# Patient Record
Sex: Female | Born: 1960 | Race: White | Hispanic: No | Marital: Married | Smoking: Never smoker
Health system: Southern US, Community
[De-identification: ages and names within clinical notes are randomized; demographics above are authoritative.]

---

## 2008-08-23 HISTORY — PX: ABDOMINAL HYSTERECTOMY: SHX81

## 2009-03-05 ENCOUNTER — Ambulatory Visit: Payer: Self-pay | Admitting: Obstetrics and Gynecology

## 2009-03-11 ENCOUNTER — Ambulatory Visit: Payer: Self-pay

## 2009-07-30 ENCOUNTER — Ambulatory Visit: Payer: Self-pay | Admitting: Obstetrics and Gynecology

## 2009-08-04 ENCOUNTER — Inpatient Hospital Stay: Payer: Self-pay | Admitting: Obstetrics and Gynecology

## 2009-09-11 ENCOUNTER — Ambulatory Visit: Payer: Self-pay

## 2010-03-10 ENCOUNTER — Ambulatory Visit: Payer: Self-pay

## 2011-04-09 ENCOUNTER — Ambulatory Visit: Payer: Self-pay

## 2012-04-12 ENCOUNTER — Ambulatory Visit: Payer: Self-pay

## 2013-06-01 ENCOUNTER — Ambulatory Visit: Payer: Self-pay

## 2013-07-05 ENCOUNTER — Ambulatory Visit: Payer: Self-pay

## 2014-09-03 ENCOUNTER — Ambulatory Visit: Payer: Self-pay

## 2015-09-16 ENCOUNTER — Other Ambulatory Visit: Payer: Self-pay | Admitting: Obstetrics and Gynecology

## 2015-09-16 DIAGNOSIS — Z1231 Encounter for screening mammogram for malignant neoplasm of breast: Secondary | ICD-10-CM

## 2015-09-23 ENCOUNTER — Ambulatory Visit
Admission: RE | Admit: 2015-09-23 | Discharge: 2015-09-23 | Disposition: A | Discharge status: Home / Self Care | Payer: BC Managed Care – PPO | Source: Ambulatory Visit | Attending: Obstetrics and Gynecology | Admitting: Obstetrics and Gynecology

## 2015-09-23 DIAGNOSIS — Z1231 Encounter for screening mammogram for malignant neoplasm of breast: Secondary | ICD-10-CM | POA: Insufficient documentation

## 2017-02-17 ENCOUNTER — Encounter: Payer: Self-pay | Admitting: Medical

## 2017-02-17 ENCOUNTER — Ambulatory Visit: Payer: Self-pay | Admitting: Medical

## 2017-02-17 VITALS — BP 130/90 | HR 79 | Temp 97.9°F | Resp 18 | Ht 64.0 in | Wt 207.0 lb

## 2017-02-17 DIAGNOSIS — M25562 Pain in left knee: Principal | ICD-10-CM

## 2017-02-17 DIAGNOSIS — M25561 Pain in right knee: Secondary | ICD-10-CM

## 2017-02-17 DIAGNOSIS — G8929 Other chronic pain: Secondary | ICD-10-CM

## 2017-02-17 NOTE — Progress Notes (Addendum)
Approved for standing desk, in basket to [employer].  Subjective:    Patient ID: Felicia Mason, female    DOB: 1961/04/23, 56 y.o.   MRN: [id number]  HPI  56 yo female started with bilateral knee pain since October with Right knee more painful. Medial pain and around the inferior part of the patella. Tennis seem to give some support. Started swimming aerobics this week with some relief.  Occasionally takes ibuprofen for pain , but does not like to take pills. No numbness or tingling. Periodically has to leave from sitting position to a standing positon and walk around for relief. Feels the knee catching in both knees. Has not exercised due to pain.   Review of Systems  Constitutional: Negative for chills and fever.  HENT: Positive for congestion. Negative for ear discharge and sore throat.   Eyes: Positive for discharge and itching.  Respiratory: Negative for cough and shortness of breath.   Cardiovascular: Negative for chest pain.  Gastrointestinal: Negative for abdominal pain.  Endocrine: Negative for polydipsia, polyphagia and polyuria.  Genitourinary: Negative for hematuria.  Musculoskeletal: Positive for gait problem.  Skin: Negative for rash.  Allergic/Immunologic: Positive for environmental allergies. Negative for food allergies.  Neurological: Negative for dizziness and syncope.  Hematological: Negative for adenopathy.  painful with walking.     Objective:   Physical Exam  Constitutional: She appears well-developed and well-nourished.  HENT:  Head: Normocephalic and atraumatic.  Eyes: EOM are normal. Pupils are equal, round, and reactive to light.  Neck: Normal range of motion.  Musculoskeletal: Normal range of motion. She exhibits no edema, tenderness or deformity.   Negative drawers test medial , lateral , anterior and posterior bilateral. 2+ popliteal pulses.       Assessment & Plan:  Bilateral knee pain Right worse then Left. Will send for x-rays.   obesity Discussed weight loss,  swimming is not painful and is good exercise.   Uses OTC allergy eye drops for her allergy symptoms which helps. Recommend standing desk for patient. To wear  Good supportive shoes. Prescripton note given to patient.  OTC Ibuprofen  200mg  one every 6 hours  5-6 days with food as needed for pain, take as directed.. Will call patient with results of x-rays, then refer to Orthopedics.

## 2017-02-18 ENCOUNTER — Ambulatory Visit
Admission: RE | Admit: 2017-02-18 | Discharge: 2017-02-18 | Disposition: A | Discharge status: Home / Self Care | Payer: BLUE CROSS/BLUE SHIELD | Source: Ambulatory Visit | Attending: Medical | Admitting: Medical

## 2017-02-18 DIAGNOSIS — M25562 Pain in left knee: Secondary | ICD-10-CM | POA: Diagnosis not present

## 2017-02-18 DIAGNOSIS — M25561 Pain in right knee: Secondary | ICD-10-CM | POA: Diagnosis not present

## 2017-02-18 DIAGNOSIS — M25569 Pain in unspecified knee: Secondary | ICD-10-CM | POA: Diagnosis present

## 2017-02-18 DIAGNOSIS — G8929 Other chronic pain: Secondary | ICD-10-CM

## 2017-02-21 ENCOUNTER — Telehealth: Payer: Self-pay | Admitting: Medical

## 2017-02-21 ENCOUNTER — Encounter: Payer: Self-pay | Admitting: Medical

## 2017-02-21 DIAGNOSIS — M25562 Pain in left knee: Secondary | ICD-10-CM

## 2017-02-21 DIAGNOSIS — M25561 Pain in right knee: Principal | ICD-10-CM

## 2017-02-21 DIAGNOSIS — G8929 Other chronic pain: Secondary | ICD-10-CM

## 2017-02-21 NOTE — Telephone Encounter (Signed)
Reviewed xray results with patient . Currently taking OTC Ibuprofen for pain on an as needed basis.  Will refer to Orthopedics for their recommendations. Right knee pain worse than left knee pain.

## 2017-02-22 ENCOUNTER — Ambulatory Visit: Payer: Self-pay | Admitting: Medical

## 2017-02-22 ENCOUNTER — Encounter: Payer: Self-pay | Admitting: Medical

## 2017-02-22 VITALS — BP 122/78 | HR 94 | Temp 99.4°F | Resp 16 | Ht 64.0 in | Wt 204.0 lb

## 2017-02-22 DIAGNOSIS — N390 Urinary tract infection, site not specified: Secondary | ICD-10-CM

## 2017-02-22 MED ORDER — CIPROFLOXACIN HCL 500 MG PO TABS: 500.0000 mg | ORAL_TABLET | Freq: Two times a day (BID) | ORAL | 0 refills | Status: DC

## 2017-02-22 NOTE — Progress Notes (Signed)
   Subjective:    Patient ID: Felicia Mason, female    DOB: Aug 17, 1961, 56 y.o.   MRN: [id number]  HPI 56 yo female last  2 days with urgency and getting up to go to the bathroom at night.Thinks she might have a urinary tract infection.   Review of Systems  Constitutional: Negative for chills and fever.  HENT: Positive for congestion. Negative for ear pain and sore throat.   Eyes: Negative for discharge and itching.  Respiratory: Negative for cough and shortness of breath.   Cardiovascular: Negative for chest pain.  Gastrointestinal: Negative for abdominal distention.  Genitourinary: Positive for frequency and urgency. Negative for dysuria and hematuria.  Musculoskeletal: Negative for back pain.  Skin: Negative for rash.  Neurological: Negative for dizziness and syncope.  Psychiatric/Behavioral: Negative for behavioral problems.   Urine dip positive for leuk esterase    Objective:   Physical Exam  Constitutional: She is oriented to person, place, and time. She appears well-developed and well-nourished.  HENT:  Head: Normocephalic and atraumatic.  Eyes: EOM are normal. Pupils are equal, round, and reactive to light.  Neck: Normal range of motion.  Musculoskeletal: Normal range of motion.  Neurological: She is alert and oriented to person, place, and time.  Skin: Skin is warm and dry.  Psychiatric: She has a normal mood and affect. Her behavior is normal. Judgment and thought content normal.  Nursing note and vitals reviewed.         Assessment & Plan:  IUrinary tract infection increase water intake  E-prescribe Cipro 500 mg  One tablet by mouth twice daily for seven days #14 no refills. Recommended OTC AZO take as directed for bladder spasms, will turn urine orange.  Return in  3-5 days if not improving.

## 2017-02-22 NOTE — Patient Instructions (Signed)
Urinary Tract Infection, Adult A urinary tract infection (UTI) is an infection of any part of the urinary tract. The urinary tract includes the:  Kidneys.  Ureters.  Bladder.  Urethra.  These organs make, store, and get rid of pee (urine) in the body. Follow these instructions at home:  Take over-the-counter and prescription medicines only as told by your doctor.  If you were prescribed an antibiotic medicine, take it as told by your doctor. Do not stop taking the antibiotic even if you start to feel better.  Avoid the following drinks: ? Alcohol. ? Caffeine. ? Tea. ? Carbonated drinks.  Drink enough fluid to keep your pee clear or pale yellow.  Keep all follow-up visits as told by your doctor. This is important.  Make sure to: ? Empty your bladder often and completely. Do not to hold pee for long periods of time. ? Empty your bladder before and after sex. ? Wipe from front to back after a bowel movement if you are female. Use each tissue one time when you wipe. Contact a doctor if:  You have back pain.  You have a fever.  You feel sick to your stomach (nauseous).  You throw up (vomit).  Your symptoms do not get better after 3 days.  Your symptoms go away and then come back. Get help right away if:  You have very bad back pain.  You have very bad lower belly (abdominal) pain.  You are throwing up and cannot keep down any medicines or water. This information is not intended to replace advice given to you by your health care provider. Make sure you discuss any questions you have with your health care provider. Document Released: 01/26/2008 Document Revised: 01/15/2016 Document Reviewed: 06/30/2015 Elsevier Interactive Patient Education  [employer]2018 Elsevier Inc. Return in 3-5 days if not improving.

## 2017-02-24 LAB — POCT URINALYSIS DIPSTICK
Bilirubin, UA: NEGATIVE
Glucose, UA: NEGATIVE
KETONES UA: NEGATIVE
Nitrite, UA: NEGATIVE
PH UA: 6.5 (ref 5.0–8.0)
PROTEIN UA: NEGATIVE
RBC UA: NEGATIVE
SPEC GRAV UA: 1.025 (ref 1.010–1.025)
UROBILINOGEN UA: 0.2 U/dL

## 2017-02-24 NOTE — Addendum Note (Signed)
Addended by: Azzie GlatterALLEN, Kareen W on: 02/24/2017 04:01 PM   Modules accepted: Orders

## 2017-03-08 ENCOUNTER — Telehealth: Payer: Self-pay

## 2017-03-08 NOTE — Telephone Encounter (Signed)
See telephone comment. 

## 2017-03-22 ENCOUNTER — Other Ambulatory Visit: Payer: Self-pay | Admitting: Obstetrics and Gynecology

## 2017-03-22 DIAGNOSIS — Z1231 Encounter for screening mammogram for malignant neoplasm of breast: Secondary | ICD-10-CM

## 2017-04-06 ENCOUNTER — Ambulatory Visit
Admission: RE | Admit: 2017-04-06 | Discharge: 2017-04-06 | Disposition: A | Discharge status: Home / Self Care | Payer: BLUE CROSS/BLUE SHIELD | Source: Ambulatory Visit | Attending: Obstetrics and Gynecology | Admitting: Obstetrics and Gynecology

## 2017-04-06 DIAGNOSIS — Z1231 Encounter for screening mammogram for malignant neoplasm of breast: Secondary | ICD-10-CM | POA: Diagnosis present

## 2017-08-30 IMAGING — MG MM DIGITAL SCREENING BILAT W/ CAD
4 series · 4 of 4 positions shown · non-contrast
Comparison: Previous exam(s).

CLINICAL DATA: Screening.

EXAM:
DIGITAL SCREENING BILATERAL MAMMOGRAM WITH CAD

[L MLO]
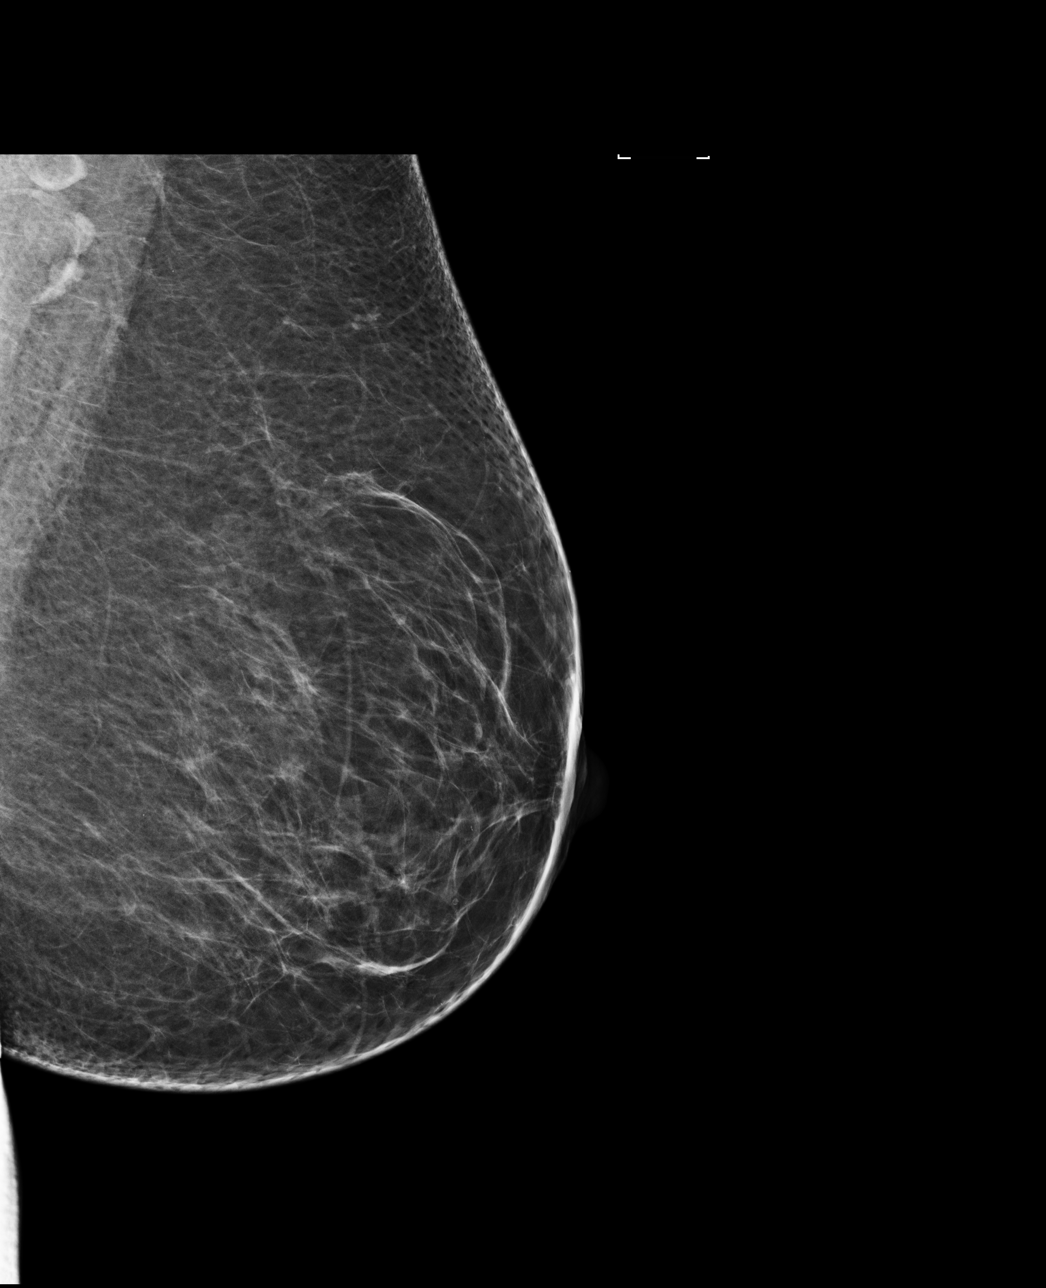

[R MLO]
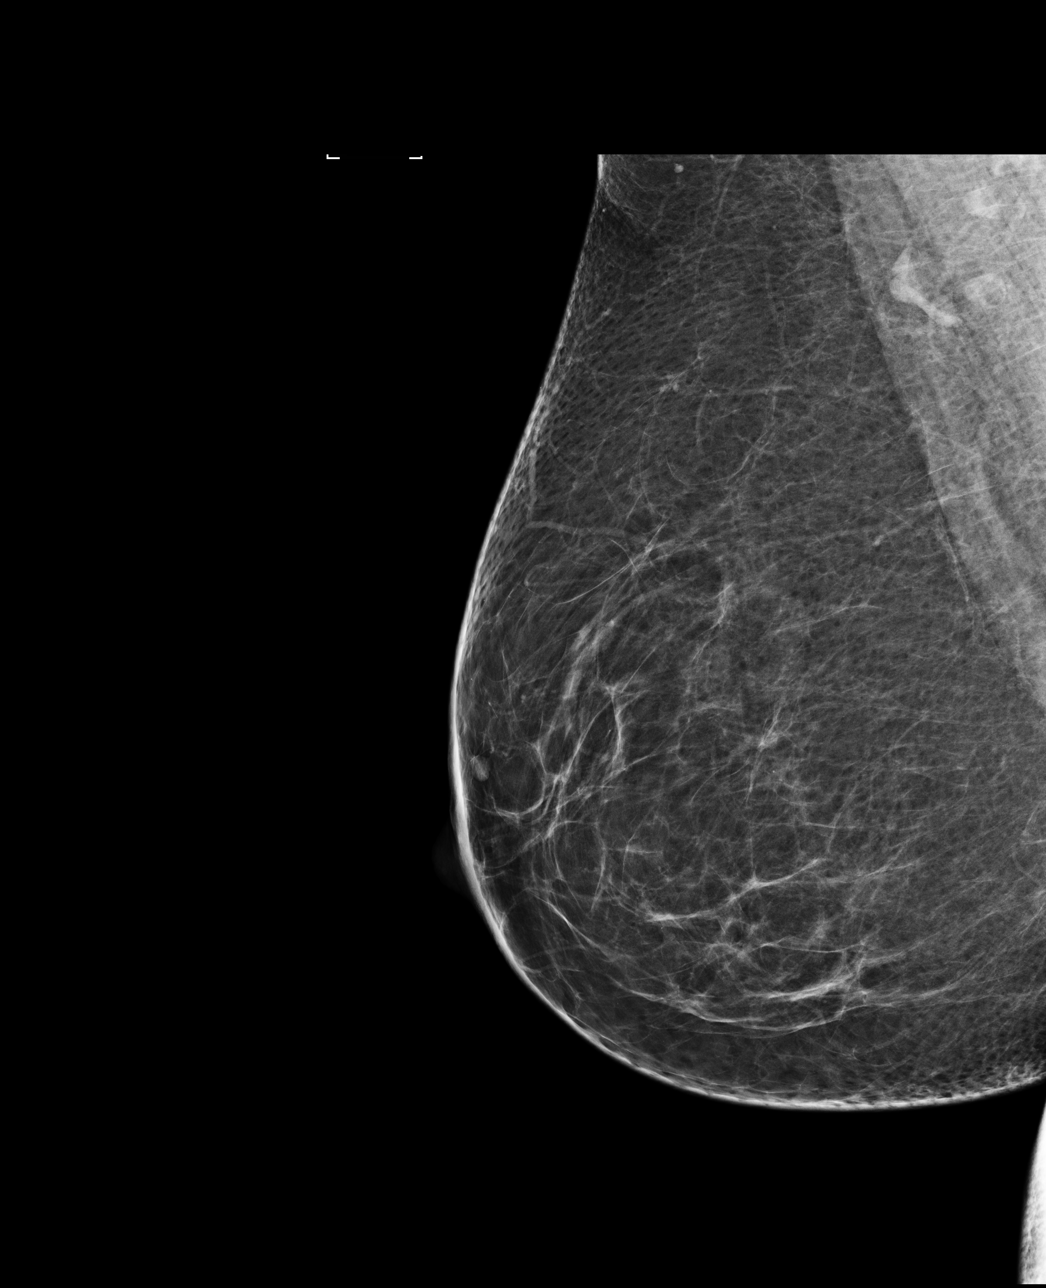

[R CC]
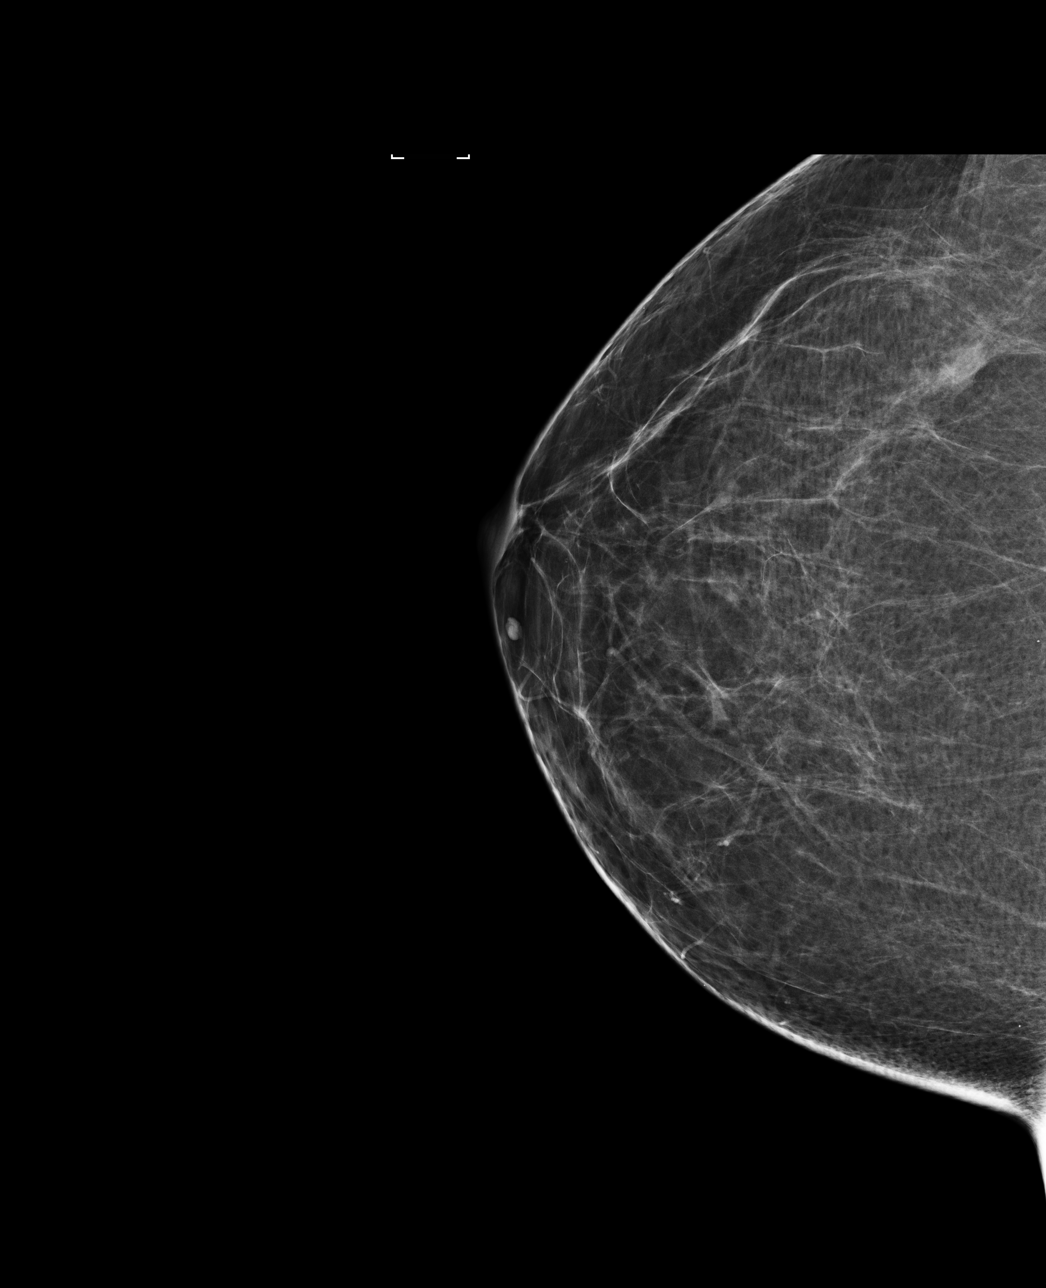

[L CC]
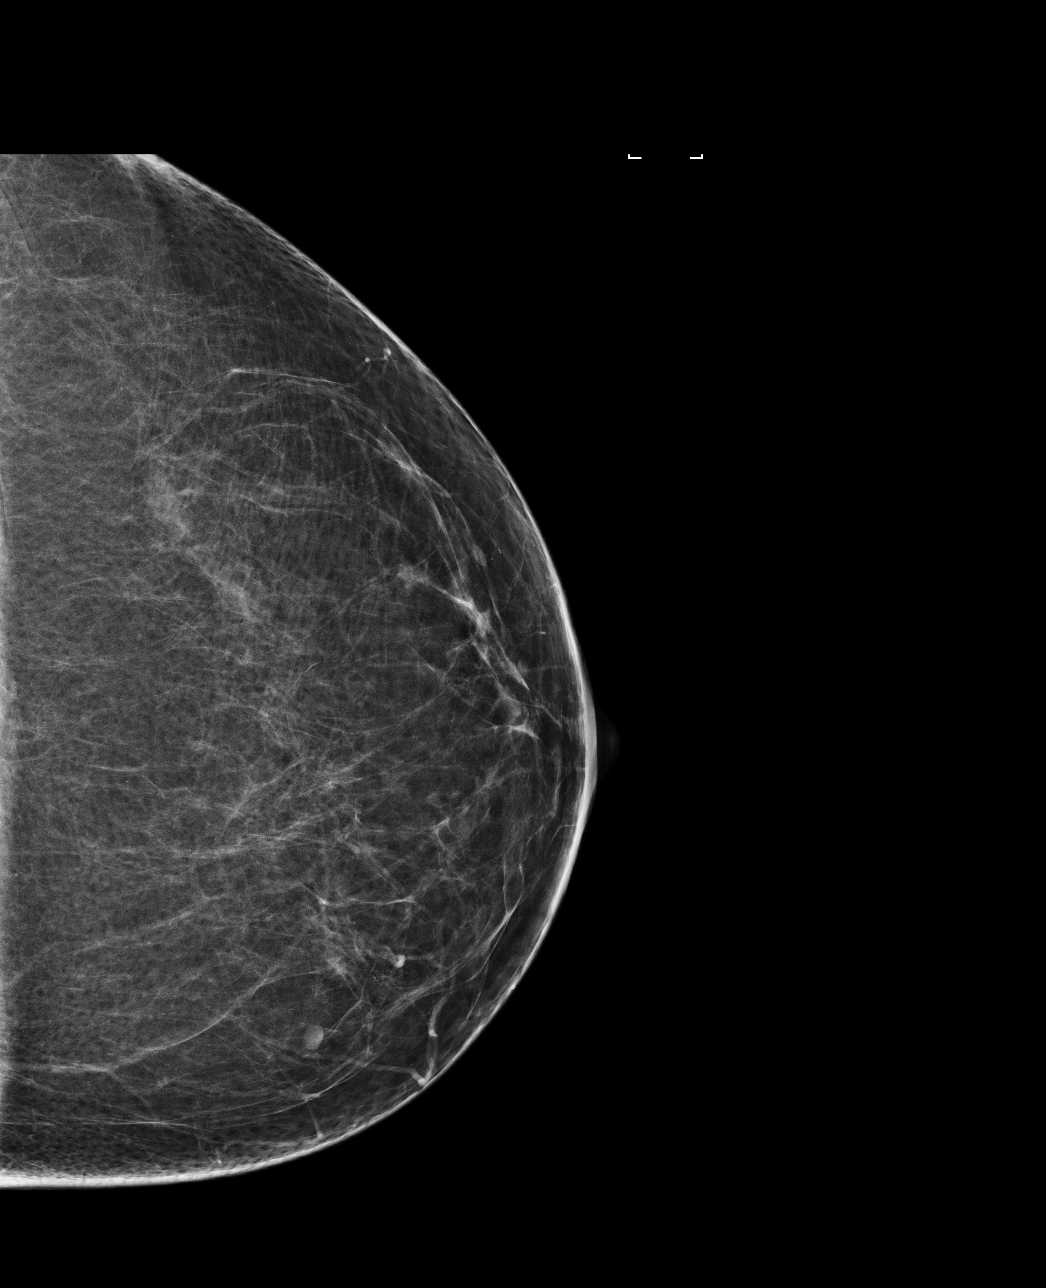

[4 of 4 positions shown; findings below may reference images not displayed]

ACR Breast Density Category b: There are scattered areas of
fibroglandular density.
FINDINGS: There are no findings suspicious for malignancy. Images were
processed with CAD.
IMPRESSION: No mammographic evidence of malignancy. A result letter of this
screening mammogram will be mailed directly to the patient.

RECOMMENDATION:
Screening mammogram in one year. (Code:AS-G-LCT)

BI-RADS CATEGORY  1: Negative.

## 2018-01-27 ENCOUNTER — Ambulatory Visit: Payer: Self-pay | Admitting: Adult Health

## 2018-01-27 VITALS — BP 104/80 | HR 80 | Temp 98.2°F | Resp 18 | Wt 206.4 lb

## 2018-01-27 DIAGNOSIS — L299 Pruritus, unspecified: Secondary | ICD-10-CM | POA: Insufficient documentation

## 2018-01-27 DIAGNOSIS — L259 Unspecified contact dermatitis, unspecified cause: Secondary | ICD-10-CM | POA: Insufficient documentation

## 2018-01-27 DIAGNOSIS — L089 Local infection of the skin and subcutaneous tissue, unspecified: Secondary | ICD-10-CM

## 2018-01-27 DIAGNOSIS — L237 Allergic contact dermatitis due to plants, except food: Secondary | ICD-10-CM

## 2018-01-27 MED ORDER — HYDROCORTISONE 1 % EX OINT: 1.0000 "application " | TOPICAL_OINTMENT | Freq: Two times a day (BID) | CUTANEOUS | 0 refills | Status: DC

## 2018-01-27 MED ORDER — PREDNISONE 10 MG (21) PO TBPK: ORAL_TABLET | ORAL | 0 refills | Status: DC

## 2018-01-27 MED ORDER — CEPHALEXIN 500 MG PO CAPS: 500.0000 mg | ORAL_CAPSULE | Freq: Two times a day (BID) | ORAL | 0 refills | Status: DC

## 2018-01-27 NOTE — Progress Notes (Signed)
Subjective:     Patient ID: Felicia Mason, female   DOB: 10-Jun-1961, 57 y.o.   MRN: [id number]   Blood pressure 104/80, pulse 80, temperature 98.2 F (36.8 C), temperature source Tympanic, resp. rate 18, weight 206 lb 6.4 oz (93.6 kg), SpO2 97 %. Poison [place]  This is a new problem. The current episode started in the past 7 days. The problem is unchanged. The affected locations include the right arm, left arm and chest (upper chest ). Pertinent negatives include no anorexia, congestion, cough, diarrhea, eye pain, facial edema, fatigue, fever, joint pain, nail changes, rhinorrhea, shortness of breath, sore throat or vomiting. Past treatments include cold compress and anti-itch cream. The treatment provided mild relief. Her past medical history is significant for allergies (food allergies ). There is no history of asthma.     Patient is a 57 year old female in no acute distress who comes to clinic with complaint of rash started last week. [place] [employer] . She has tried IVAREST poison IVY Itch cream without much improvement.- mild she reports.   Left arm she scratched intensely and reports she noticed it was getting red area around the posion oak in the center.   Denies any drainage from left arm area.Mild skin warmth.  Denies any tick bites.   Review of Systems  Constitutional: Negative.  Negative for fatigue and fever.  HENT: Negative.  Negative for congestion, rhinorrhea and sore throat.   Eyes: Negative.  Negative for pain.  Respiratory: Negative.  Negative for cough and shortness of breath.   Cardiovascular: Negative.   Gastrointestinal: Negative.  Negative for anorexia, diarrhea and vomiting.  Endocrine: Negative.   Genitourinary: Negative.   Musculoskeletal: Negative.  Negative for joint pain.  Skin: Positive for color change and rash. Negative for nail changes, pallor and wound.  Allergic/Immunologic: Negative.   Neurological: Negative.     Hematological: Negative.   Psychiatric/Behavioral: Negative.        Objective:   Physical Exam  Constitutional: She is oriented to person, place, and time. She appears well-developed and well-nourished. She is active.  Non-toxic appearance. She does not have a sickly appearance. She does not appear ill. No distress.  Patient is alert and oriented and responsive to questions Engages in eye contact with provider. Speaks in full sentences without any pauses without any shortness of breath or distress.    HENT:  Head: Normocephalic and atraumatic.  Right Ear: Hearing and external ear normal.  Left Ear: Hearing and external ear normal.  Nose: Nose normal. Right sinus exhibits no maxillary sinus tenderness and no frontal sinus tenderness. Left sinus exhibits no maxillary sinus tenderness and no frontal sinus tenderness.  Mouth/Throat: Uvula is midline, oropharynx is clear and moist and mucous membranes are normal. No uvula swelling. No oropharyngeal exudate. No tonsillar exudate.  Eyes: Pupils are equal, round, and reactive to light. Conjunctivae, EOM and lids are normal. Right eye exhibits no discharge. Left eye exhibits no discharge. Right conjunctiva is not injected. Right conjunctiva has no hemorrhage. Left conjunctiva is not injected. Left conjunctiva has no hemorrhage. No scleral icterus.  Neck: Trachea normal, normal range of motion, full passive range of motion without pain and phonation normal. Neck supple. No Brudzinski's sign noted.  Cardiovascular: Normal rate, regular rhythm, normal heart sounds and intact distal pulses. Exam reveals no gallop and no friction rub.  No murmur heard. Pulmonary/Chest: Effort normal and breath sounds normal. No stridor. No respiratory  distress. She has no wheezes. She has no rales. She exhibits no tenderness.  Abdominal: Soft.  Musculoskeletal: Normal range of motion.  Lymphadenopathy:       Head (right side): No submental, no submandibular, no  tonsillar, no preauricular, no posterior auricular and no occipital adenopathy present.       Head (left side): No submental, no submandibular, no tonsillar, no preauricular, no posterior auricular and no occipital adenopathy present.    She has no cervical adenopathy.  Neurological: She is alert and oriented to person, place, and time. She has normal strength. She displays normal reflexes. No cranial nerve deficit or sensory deficit. She exhibits normal muscle tone. She displays a negative Romberg sign. Coordination normal.  Skin: Skin is warm and dry. Capillary refill takes less than 2 seconds. Rash noted. Rash is maculopapular and vesicular. She is not diaphoretic. There is erythema. No pallor.     Areas of erythema with maculopapular vesicular rash- pruritic marked with black lines on diagram.   Pink flat erythema, mildly warm to touch 3 cm x cm marked with circular blackline on diagram, No drainage.   2 + radial pulse left and right.     Psychiatric: She has a normal mood and affect. Her speech is normal and behavior is normal. Judgment and thought content normal. Cognition and memory are normal.       Assessment:     Contact dermatitis due to poison oak  Contact dermatitis, unspecified contact dermatitis type, unspecified trigger  Skin infection  Pruritus     Plan:     Meds ordered this encounter  Medications  . cephALEXin (KEFLEX) 500 MG capsule    Sig: Take 1 capsule (500 mg total) by mouth 2 (two) times daily.    Dispense:  20 capsule    Refill:  0  . predniSONE (STERAPRED UNI-PAK 21 TAB) 10 MG (21) TBPK tablet    Sig: PO: Take 6 tablets on day 1:Take 5 tablets day 2:Take 4 tablets day 3: Take 3 tablets day 4:Take 2 tablets day five: 5 Take 1 tablet day 6    Dispense:  21 tablet    Refill:  0  . hydrocortisone 1 % ointment    Sig: Apply 1 application topically 2 (two) times daily.    Dispense:  30 g    Refill:  0  Advised to be seen if spreads to face or near  eyes at all.  If left arm erythema worsens return to office or be seen in urgent care/ ER.   Benadryl at bedtime per package instructions PRN Itching.  Cool showers and cool compresses only.  Do not take hot showers. Wash sheets etc. Do not touch from area to area. Avoid scratching.     Advised patient call the office or your primary care doctor for an appointment if no improvement within 72 hours or if any symptoms change or worsen at any time  Advised ER or urgent Care if after hours or on weekend. Call 911 for emergency symptoms at any time.Patinet verbalized understanding of all instructions given/reviewed and treatment plan and has no further questions or concerns at this time.   Patient verbalized understanding of all instructions given and denies any further questions at this time.

## 2018-01-27 NOTE — Patient Instructions (Signed)
Poison Ivy Dermatitis Poison ivy dermatitis is redness and soreness (inflammation) of the skin. It is caused by a chemical that is found on the leaves of the poison ivy plant. You may also have itching, a rash, and blisters. Symptoms often clear up in 1-2 weeks. You may get this condition by touching a poison ivy plant. You can also get it by touching something that has the chemical on it. This may include animals or objects that have come in contact with the plant. Follow these instructions at home: General instructions  Take or apply over-the-counter and prescription medicines only as told by your doctor.  If you touch poison ivy, wash your skin with soap and cold water right away.  Use hydrocortisone creams or calamine lotion as needed to help with itching.  Take oatmeal baths as needed. Use colloidal oatmeal. You can get this at a pharmacy or grocery store. Follow the instructions on the package.  Do not scratch or rub your skin.  While you have the rash, wash your clothes right after you wear them. Prevention  Know what poison ivy looks like so you can avoid it. This plant has three leaves with flowering branches on a single stem. The leaves are glossy. They have uneven edges that come to a point at the front.  If you have touched poison ivy, wash with soap and water right away. Be sure to wash under your fingernails.  When hiking or camping, wear long pants, a long-sleeved shirt, tall socks, and hiking boots. You can also use a lotion on your skin that helps to prevent contact with the chemical on the plant.  If you think that your clothes or outdoor gear came in contact with poison ivy, rinse them off with a garden hose before you bring them inside your house. Contact a doctor if:  You have open sores in the rash area.  You have more redness, swelling, or pain in the affected area.  You have redness that spreads beyond the rash area.  You have fluid, blood, or pus coming from  the affected area.  You have a fever.  You have a rash over a large area of your body.  You have a rash on your eyes, mouth, or genitals.  Your rash does not get better after a few days. Get help right away if:  Your face swells or your eyes swell shut.  You have trouble breathing.  You have trouble swallowing. This information is not intended to replace advice given to you by your health care provider. Make sure you discuss any questions you have with your health care provider. Document Released: 09/11/2010 Document Revised: 01/15/2016 Document Reviewed: 01/15/2015 Elsevier Interactive Patient Education  2018 Else[employer] Inc.   Cellulitis, Adult Cellulitis is a skin infection. The infected area is usually red and sore. This condition occurs most often in the arms and lower legs. It is very important to get treated for this condition. Follow these instructions at home:  Take over-the-counter and prescription medicines only as told by your doctor.  If you were prescribed an antibiotic medicine, take it as told by your doctor. Do not stop taking the antibiotic even if you start to feel better.  Drink enough fluid to keep your pee (urine) clear or pale yellow.  Do not touch or rub the infected area.  Raise (elevate) the infected area above the level of your heart while you are sitting or lying down.  Place warm or cold wet cloths (warm  or cold compresses) on the infected area. Do this as told by your doctor.  Keep all follow-up visits as told by your doctor. This is important. These visits let your doctor make sure your infection is not getting worse. Contact a doctor if:  You have a fever.  Your symptoms do not get better after 1-2 days of treatment.  Your bone or joint under the infected area starts to hurt after the skin has healed.  Your infection comes back. This can happen in the same area or another area.  You have a swollen bump in the infected area.  You have new  symptoms.  You feel ill and also have muscle aches and pains. Get help right away if:  Your symptoms get worse.  You feel very sleepy.  You throw up (vomit) or have watery poop (diarrhea) for a long time.  There are red streaks coming from the infected area.  Your red area gets larger.  Your red area turns darker. This information is not intended to replace advice given to you by your health care provider. Make sure you discuss any questions you have with your health care provider. Document Released: 01/26/2008 Document Revised: 01/15/2016 Document Reviewed: 06/18/2015 Elsevier Interactive Patient Education  2018 [employer]

## 2018-04-27 ENCOUNTER — Other Ambulatory Visit: Payer: Self-pay | Admitting: Obstetrics and Gynecology

## 2018-04-27 DIAGNOSIS — Z1231 Encounter for screening mammogram for malignant neoplasm of breast: Secondary | ICD-10-CM

## 2018-05-11 ENCOUNTER — Ambulatory Visit
Admission: RE | Admit: 2018-05-11 | Discharge: 2018-05-11 | Disposition: A | Discharge status: Home / Self Care | Payer: BLUE CROSS/BLUE SHIELD | Source: Ambulatory Visit | Attending: Obstetrics and Gynecology | Admitting: Obstetrics and Gynecology

## 2018-05-11 DIAGNOSIS — Z1231 Encounter for screening mammogram for malignant neoplasm of breast: Secondary | ICD-10-CM | POA: Diagnosis present

## 2018-10-30 ENCOUNTER — Encounter: Payer: Self-pay | Admitting: Medical

## 2018-10-30 ENCOUNTER — Other Ambulatory Visit: Payer: Self-pay

## 2018-10-30 ENCOUNTER — Ambulatory Visit: Payer: Self-pay | Admitting: Medical

## 2018-10-30 VITALS — BP 134/73 | HR 94 | Temp 98.8°F | Resp 16 | Wt 216.2 lb

## 2018-10-30 DIAGNOSIS — N3000 Acute cystitis without hematuria: Secondary | ICD-10-CM

## 2018-10-30 DIAGNOSIS — R300 Dysuria: Secondary | ICD-10-CM

## 2018-10-30 LAB — POCT URINALYSIS DIPSTICK
Bilirubin, UA: NEGATIVE
Blood, UA: NEGATIVE
Glucose, UA: NEGATIVE
Ketones, UA: NEGATIVE
Nitrite, UA: NEGATIVE
Protein, UA: POSITIVE — AB
Spec Grav, UA: 1.025 (ref 1.010–1.025)
UROBILINOGEN UA: 0.2 U/dL
pH, UA: 5 (ref 5.0–8.0)

## 2018-10-30 MED ORDER — CIPROFLOXACIN HCL 500 MG PO TABS: 500.0000 mg | ORAL_TABLET | Freq: Two times a day (BID) | ORAL | 0 refills | Status: DC

## 2018-10-30 NOTE — Patient Instructions (Signed)
cipro Ciprofloxacin tablets What is this medicine? CIPROFLOXACIN (sip roe FLOX a sin) is a quinolone antibiotic. It is used to treat certain kinds of bacterial infections. It will not work for colds, flu, or other viral infections. This medicine may be used for other purposes; ask your health care provider or pharmacist if you have questions. COMMON BRAND NAME(S): Cipro What should I tell my health care provider before I take this medicine? They need to know if you have any of these conditions: -bone problems -diabetes -heart disease -high blood pressure -history of irregular heartbeat -history of low levels of potassium in the blood -joint problems -kidney disease -liver disease -mental illness -myasthenia gravis -seizures -tendon problems -tingling of the fingers or toes, or other nerve disorder -an unusual or allergic reaction to ciprofloxacin, other antibiotics or medicines, foods, dyes, or preservatives -pregnant or trying to get pregnant -breast-feeding How should I use this medicine? Take this medicine by mouth with a full glass of water. Follow the directions on the prescription label. You can take it with or without food. If it upsets your stomach, take it with food. Take your medicine at regular intervals. Do not take your medicine more often than directed. Take all of your medicine as directed even if you think you are better. Do not skip doses or stop your medicine early. Avoid antacids, aluminum, calcium, iron, magnesium, and zinc products for 6 hours before and 2 hours after taking a dose of this medicine. A special MedGuide will be given to you by the pharmacist with each prescription and refill. Be sure to read this information carefully each time. Talk to your pediatrician regarding the use of this medicine in children. Special care may be needed. Overdosage: If you think you have taken too much of this medicine contact a poison control center or emergency room at  once. NOTE: This medicine is only for you. Do not share this medicine with others. What if I miss a dose? If you miss a dose, take it as soon as you can. If it is almost time for your next dose, take only that dose. Do not take double or extra doses. What may interact with this medicine? Do not take this medicine with any of the following medications: -cisapride -dofetilide -dronedarone -flibanserin -lomitapide -pimozide -thioridazine -tizanidine -ziprasidone This medicine may also interact with the following medications: -antacids -birth control pills -caffeine -certain medicines for diabetes, like glipizide, glyburide, or insulin -certain medicines that treat or prevent blood clots like warfarin -clozapine -cyclosporine -didanosine buffered tablets or powder -duloxetine -lanthanum carbonate -lidocaine -methotrexate -multivitamins -NSAIDS, medicines for pain and inflammation, like ibuprofen or naproxen -olanzapine -omeprazole -other medicines that prolong the QT interval (cause an abnormal heart rhythm) -phenytoin -probenecid -ropinirole -sevelamer -sildenafil -sucralfate -theophylline -zolpidem This list may not describe all possible interactions. Give your health care provider a list of all the medicines, herbs, non-prescription drugs, or dietary supplements you use. Also tell them if you smoke, drink alcohol, or use illegal drugs. Some items may interact with your medicine. What should I watch for while using this medicine? Tell your doctor or healthcare professional if your symptoms do not start to get better or if they get worse. Do not treat diarrhea with over the counter products. Contact your doctor if you have diarrhea that lasts more than 2 days or if it is severe and watery. Check with your doctor or health care professional if you get an attack of severe diarrhea, nausea and vomiting, or if you  sweat a lot. The loss of too much body fluid can make it  dangerous for you to take this medicine. This medicine may affect blood sugar levels. If you have diabetes, check with your doctor or health care professional before you change your diet or the dose of your diabetic medicine. You may get drowsy or dizzy. Do not drive, use machinery, or do anything that needs mental alertness until you know how this medicine affects you. Do not sit or stand up quickly, especially if you are an older patient. This reduces the risk of dizzy or fainting spells. This medicine can make you more sensitive to the sun. Keep out of the sun. If you cannot avoid being in the sun, wear protective clothing and use a sunscreen. Do not use sun lamps or tanning beds/booths. What side effects may I notice from receiving this medicine? Side effects that you should report to your doctor or health care professional as soon as possible: -allergic reactions like skin rash or hives, swelling of the face, lips, or tongue -anxious -bloody or watery diarrhea -confusion -depressed mood -fast, irregular heartbeat -fever -hallucination, loss of contact with reality -joint, muscle, or tendon pain or swelling -loss of memory -pain, tingling, numbness in the hands or feet -seizures -signs and symptoms of aortic dissection such as sudden chest, stomach, or back pain -signs and symptoms of high blood sugar such as dizziness; dry mouth; dry skin; fruity breath; nausea; stomach pain; increased hunger or thirst; increased urination -signs and symptoms of liver injury like dark yellow or brown urine; general ill feeling or flu-like symptoms; light-colored stools; loss of appetite; nausea; right upper belly pain; unusually weak or tired; yellowing of the eyes or skin -signs and symptoms of low blood sugar such as feeling anxious; confusion; dizziness; increased hunger; unusually weak or tired; sweating; shakiness; cold; irritable; headache; blurred vision; fast heartbeat; loss of consciousness; pale  skin -suicidal thoughts or other mood changes -sunburn -unusually weak or tired Side effects that usually do not require medical attention (report to your doctor or health care professional if they continue or are bothersome): -dry mouth -headache -nausea -trouble sleeping This list may not describe all possible side effects. Call your doctor for medical advice about side effects. You may report side effects to FDA at 1-800-FDA-1088. Where should I keep my medicine? Keep out of the reach of children. Store at room temperature below 30 degrees C (86 degrees F). Keep container tightly closed. Throw away any unused medicine after the expiration date. NOTE: This sheet is a summary. It may not cover all possible information. If you have questions about this medicine, talk to your doctor, pharmacist, or health care provider.  2019 Elsevier/Gold Standard (2018-03-29 15:54:38)

## 2018-10-30 NOTE — Progress Notes (Signed)
   Subjective:    Patient ID: Felicia Mason, female    DOB: [birth year], 58 y.o.   MRN: [id number]  HPI 58 yo female Started on Frday , Urgent care closed on Saturday.  Last UTI one year ago took Cipro without difficulty. Denies fever, mild back pain, no cough or shortness of breath, hx of environmental allergies(ie pollen) Denies CP or shortness of breath..  Blood pressure 134/73, pulse 94, temperature 98.8 F (37.1 C), temperature source Tympanic, resp. rate 16, weight 216 lb 3.2 oz (98.1 kg), SpO2 97 %. No Known Allergies  Review of Systems  Constitutional: Positive for fever (Saturday). Negative for chills and fatigue.  Respiratory: Negative for cough and shortness of breath.   Cardiovascular: Negative for chest pain.  Genitourinary: Positive for decreased urine volume, difficulty urinating, dysuria, genital sores and urgency. Negative for frequency, hematuria, vaginal bleeding, vaginal discharge and vaginal pain.  Musculoskeletal: Positive for back pain (back pain started Friday). Negative for myalgias.  Skin: Negative for wound.  Allergic/Immunologic: Positive for environmental allergies. Negative for food allergies.  Neurological: Negative for dizziness, syncope, light-headedness and headaches.  Hematological: Negative for adenopathy.  Psychiatric/Behavioral: Negative for agitation, self-injury and suicidal ideas.       Objective:   Physical Exam Vitals signs reviewed.  Constitutional:      Appearance: Normal appearance. She is obese.  HENT:     Head: Normocephalic and atraumatic.     Mouth/Throat:     Mouth: Mucous membranes are moist.  Eyes:     Extraocular Movements: Extraocular movements intact.     Conjunctiva/sclera: Conjunctivae normal.     Pupils: Pupils are equal, round, and reactive to light.  Neck:     Musculoskeletal: Normal range of motion.  Pulmonary:     Effort: Pulmonary effort is normal.  Abdominal:     General: Bowel sounds are normal. There  is no distension.     Palpations: Abdomen is soft. There is no mass.     Tenderness: There is no abdominal tenderness. There is no right CVA tenderness, left CVA tenderness, guarding or rebound.     Hernia: No hernia is present.  Musculoskeletal: Normal range of motion.  Skin:    General: Skin is warm and dry.  Neurological:     General: No focal deficit present.     Mental Status: She is alert and oriented to person, place, and time.  Psychiatric:        Mood and Affect: Mood normal.        Behavior: Behavior normal.        Thought Content: Thought content normal.        Judgment: Judgment normal.   no swelling in ankles.        Assessment & Plan:  UTI C&S sent off of urine. Will call if resistance. Meds ordered this encounter  Medications  . ciprofloxacin (CIPRO) 500 MG tablet    Sig: Take 1 tablet (500 mg total) by mouth 2 (two) times daily.    Dispense:  6 tablet    Refill:  0   Increase water intake , my use OTC Azo per package instructions. Return in 3-5 days if not improving. Patient verbalizes understanding and has no questions at discharge.

## 2018-11-01 LAB — URINE CULTURE

## 2018-11-09 ENCOUNTER — Telehealth: Payer: Self-pay

## 2018-11-09 NOTE — Telephone Encounter (Signed)
Follow up call to patient after being placed on Cipro for UTI.  She has completed the dosage and is feeling much better (asymptomatic). Patient will follow up with the clinic as needed.

## 2019-05-09 ENCOUNTER — Other Ambulatory Visit: Payer: Self-pay | Admitting: Obstetrics and Gynecology

## 2019-05-09 DIAGNOSIS — Z1231 Encounter for screening mammogram for malignant neoplasm of breast: Secondary | ICD-10-CM

## 2019-05-23 ENCOUNTER — Ambulatory Visit: Payer: Self-pay

## 2019-05-23 ENCOUNTER — Other Ambulatory Visit: Payer: Self-pay

## 2019-05-23 DIAGNOSIS — Z23 Encounter for immunization: Secondary | ICD-10-CM

## 2019-06-15 ENCOUNTER — Ambulatory Visit
Admission: RE | Admit: 2019-06-15 | Discharge: 2019-06-15 | Disposition: A | Discharge status: Home / Self Care | Payer: BC Managed Care – PPO | Source: Ambulatory Visit | Attending: Obstetrics and Gynecology | Admitting: Obstetrics and Gynecology

## 2019-06-15 ENCOUNTER — Other Ambulatory Visit: Payer: Self-pay

## 2019-06-15 DIAGNOSIS — Z1231 Encounter for screening mammogram for malignant neoplasm of breast: Secondary | ICD-10-CM | POA: Insufficient documentation

## 2020-05-28 ENCOUNTER — Other Ambulatory Visit: Payer: Self-pay | Admitting: Obstetrics and Gynecology

## 2020-05-28 DIAGNOSIS — Z1231 Encounter for screening mammogram for malignant neoplasm of breast: Secondary | ICD-10-CM

## 2020-06-26 ENCOUNTER — Ambulatory Visit
Admission: RE | Admit: 2020-06-26 | Discharge: 2020-06-26 | Disposition: A | Discharge status: Home / Self Care | Payer: BC Managed Care – PPO | Source: Ambulatory Visit | Attending: Obstetrics and Gynecology | Admitting: Obstetrics and Gynecology

## 2020-06-26 ENCOUNTER — Other Ambulatory Visit: Payer: Self-pay

## 2020-06-26 DIAGNOSIS — Z1231 Encounter for screening mammogram for malignant neoplasm of breast: Secondary | ICD-10-CM | POA: Insufficient documentation

## 2020-11-17 ENCOUNTER — Telehealth: Payer: Self-pay | Admitting: Medical

## 2020-11-17 ENCOUNTER — Other Ambulatory Visit: Payer: Self-pay

## 2020-11-17 DIAGNOSIS — U071 COVID-19: Secondary | ICD-10-CM

## 2020-11-17 NOTE — Patient Instructions (Signed)

## 2020-11-17 NOTE — Progress Notes (Signed)
   Subjective:    Patient ID: Felicia Mason, female    DOB: 08/06/1961, 60 y.o.   MRN: [id number]  HPI 60 yo female in non acute distress, consents to telemedicne appointment. On Sunday 11/16/2020 with chills, feverish feeling, malaise, nasal congestion, HA, mild cough due to PND. Denies any CP, SOB, abdominal pain or diarrhea. She states initially she thought it was allergies but then she looked up symptoms of Covid-19 ( symptoms are similar) so she did a Covid-19 test which resulted positive. Treating self with Menthol and children's Claritn for her allergies.  No Known Allergies   Review of Systems  Constitutional: Positive for chills and fever (possibly).  HENT: Positive for congestion, postnasal drip, rhinorrhea, sinus pressure, sinus pain and sore throat.   Respiratory: Positive for cough (due to PND). Negative for shortness of breath.   Cardiovascular: Negative for chest pain.  Gastrointestinal: Negative for abdominal pain and diarrhea.  Allergic/Immunologic: Positive for environmental allergies.  Neurological: Positive for headaches.       Objective:   Physical Exam AXOX3 No acute distress noted during phone call. Patient does sound congested, no cough noted  Or SOB.Marland Kitchen No physical exam performed due to telemedicine appointment.      Home test Covid-19 Positive Assessment & Plan:  Covid-19 Positive Isolate, Rest, increase fluids,  OTC Motrin or Tylenol for pain or fever, take per package instructions. OTC Delsym for cough, OTC plain Mucinex as an expectorant. Recommended  OTC Flonase for nasal congestion. May return to work on April 4th 2022. Call the office if you have any further questions or concerns. Patients verbalizes understanding and has no questions at the end of our phone call.

## 2021-01-28 ENCOUNTER — Ambulatory Visit: Payer: BC Managed Care – PPO | Admitting: Medical

## 2021-01-28 ENCOUNTER — Other Ambulatory Visit: Payer: Self-pay

## 2021-01-28 ENCOUNTER — Encounter: Payer: Self-pay | Admitting: Medical

## 2021-01-28 VITALS — BP 124/86 | HR 96 | Temp 98.5°F | Resp 18 | Wt 219.8 lb

## 2021-01-28 DIAGNOSIS — T63441A Toxic effect of venom of bees, accidental (unintentional), initial encounter: Secondary | ICD-10-CM

## 2021-01-28 MED ORDER — AMOXICILLIN-POT CLAVULANATE 875-125 MG PO TABS: 1.0000 | ORAL_TABLET | Freq: Two times a day (BID) | ORAL | 0 refills | Status: DC

## 2021-01-28 NOTE — Progress Notes (Addendum)
   Subjective:    Patient ID: Felicia Mason, female    DOB: [birth year], 60 y.o.   MRN: [id number]  HPI  60 yo female  In non acute distress, stung by a bee on Sunday night to right ankle. Area is swollen and itchy not painful. She wanted it checked.  Blood pressure 124/86, pulse 96, temperature 98.5 F (36.9 C), temperature source Oral, resp. rate 18, weight 219 lb 12.8 oz (99.7 kg).  No Known Allergies   Review of Systems  Constitutional: Negative for chills and fever.  Musculoskeletal: Positive for joint swelling (right ankle , sting on medial side of ankle.). Negative for gait problem.  Skin: Positive for color change (erythema).       Swollen ankle and foot on the right foot/ankle.       Objective:   Physical Exam Vitals and nursing note reviewed.  Constitutional:      Appearance: Normal appearance.  HENT:     Head: Normocephalic and atraumatic.     Mouth/Throat:     Mouth: Mucous membranes are moist.     Pharynx: Oropharynx is clear.  Eyes:     Extraocular Movements: Extraocular movements intact.     Conjunctiva/sclera: Conjunctivae normal.     Pupils: Pupils are equal, round, and reactive to light.  Cardiovascular:     Rate and Rhythm: Normal rate and regular rhythm.     Pulses: Normal pulses.  Pulmonary:     Effort: Pulmonary effort is normal.  Musculoskeletal:        General: Swelling and tenderness present.     Cervical back: Normal range of motion.     Right lower leg: Edema (ankle into foot) present.  Skin:    General: Skin is warm and dry.     Capillary Refill: Capillary refill takes less than 2 seconds.     Findings: Erythema present.  Neurological:     General: No focal deficit present.     Mental Status: She is alert and oriented to person, place, and time.  Psychiatric:        Mood and Affect: Mood normal.        Behavior: Behavior normal.        Thought Content: Thought content normal.        Judgment: Judgment normal.            Assessment & Plan:  Bee sting, right ankle ( medially) ICE, Elevate, Compression ( w/ace bandage, showed patient how to wrap foot). Ibuprofen 800mg  ( 4 tablets ) every 8 hours to help with swelling x 5-7 days, take with food.. Patient declines prednisone at this time " it makes me mean". Meds ordered this encounter  Medications  . amoxicillin-clavulanate (AUGMENTIN) 875-125 MG tablet    Sig: Take 1 tablet by mouth 2 (two) times daily.    Dispense:  20 tablet    Refill:  0  take antibiotics with food. Take childrens Claritin for swelling and itching per package directions.  (patient is sensitive to medications so this is why she does childrens Claritin). Return or call the clinic or seek medical care,  if ankle/foot  is worsening or increased redness, or fever or chills. I demonstrated how to ace  wrap ankle /foot to patient. Patient verbalizes understanding and has no questions at discharge.

## 2021-01-28 NOTE — Patient Instructions (Addendum)
CE, Elevate, Compression ( w/ace bandage, showed patient how to wrap foot). Ibuprofen 842m ( 4 tablets ) every 8 hours to help with swelling x 5-7 days, take with food.. Patient declines prednisone at this time " it makes me mean". Meds ordered this encounter  Medications  . amoxicillin-clavulanate (AUGMENTIN) 875-125 MG tablet    Sig: Take 1 tablet by mouth 2 (two) times daily.    Dispense:  20 tablet    Refill:  0  take antibiotics with food. Take childrens Claritin for swelling and itching per package Return or call the clinic or seek medical care,  if ankle/foot  is worsening or increased redness, or fever or chills. I demonstrated how to ace  wrap ankle /foot to patient.      Bee, Wasp, or [employer] Adult Bees, wasps, and hornets are part of a family of insects that can sting people. These stings can cause pain and inflammation, but they are usually not serious. However, some people may have an allergic reaction to a sting. This can cause the symptoms to be more severe. What increases the risk? You may be at a greater risk of getting stung if you:  Provoke a stinging insect by swatting or disturbing it.  Wear strong-smelling soaps, deodorants, or body sprays.  Spend time outdoors near gardens with flowers or fruit trees or in clothes that expose skin.  Eat or drink outside. What are the signs or symptoms? Common symptoms of this condition include:  A red lump in the skin that sometimes has a tiny hole in the center. In some cases, a stinger may be in the center of the wound.  Pain and itching at the sting site.  Redness and swelling around the sting site. If you have an allergic reaction (localized allergic reaction), the swelling and redness may spread out from the sting site. In some cases, this reaction can continue to develop over the next 24-48 hours. In rare cases, a person may have a severe allergic reaction (anaphylactic reaction) to a sting. Symptoms of an  anaphylactic reaction may include:  Wheezing or difficulty breathing.  Raised, itchy, red patches on the skin (hives).  Nausea or vomiting.  Abdominal cramping.  Diarrhea.  Tightness in the chest or chest pain.  Dizziness or fainting.  Redness of the face (flushing).  Hoarse voice.  Swollen tongue, lips, or face. How is this diagnosed? This condition is usually diagnosed based on your symptoms and medical history as well as a physical exam. You may have an allergy test to determine if you are allergic to the substance that the insect injected during the sting (venom). How is this treated? If you were stung by a bee, the stinger and a small sac of venom may be in the wound. It is important to remove the stinger as soon as possible. You can do this by brushing across the wound with gauze, a fingernail, or a flat card such as a credit card. Removing the stinger can help reduce the severity of your body's reaction to the sting. Most stings can be treated with:  Icing to reduce swelling in the area.  Medicines (antihistamines) to treat itching or an allergic reaction.  Medicines to help reduce pain. These may be medicines that you take by mouth, or medicated creams or lotions that you apply to your skin. Pay close attention to your symptoms after you have been stung. If possible, have someone stay with you to make sure you do not have  an allergic reaction. If you have any signs of an allergic reaction, call your health care provider. If you have ever had a severe allergic reaction, your health care provider may give you an inhaler or injectable medicine (epinephrine auto-injector) to use if necessary. Follow these instructions at home:  Wash the sting site 2-3 times each day with soap and water as told by your health care provider.  Apply or take over-the-counter and prescription medicines only as told by your health care provider.  If directed, apply ice to the sting area. ? Put  ice in a plastic bag. ? Place a towel between your skin and the bag. ? Leave the ice on for 20 minutes, 2-3 times a day.  Do not scratch the sting area.  If you had a severe allergic reaction to a sting, you may need: ? To wear a medical bracelet or necklace that lists the allergy. ? To learn when and how to use an anaphylaxis kit or epinephrine injection. Your family members and coworkers may also need to learn this. ? To carry an anaphylaxis kit or epinephrine injection with you at all times.   How is this prevented?  Avoid swatting at stinging insects and disturbing insect nests.  Do not use fragrant soaps or lotions.  Wear shoes, pants, and long sleeves when spending time outdoors, especially in grassy areas where stinging insects are common.  Keep outdoor areas free from nests or hives.  Keep food and drink containers covered when eating outdoors.  Avoid working or sitting near [employer], if possible.  Wear gloves if you are gardening or working outdoors.  If an attack by a stinging insect or a swarm seems likely in the moment, move away from the area or find a barrier between you and the insect(s), such as a door. Contact a health care provider if:  Your symptoms do not get better in 2-3 days.  You have redness, swelling, or pain that spreads beyond the area of the sting.  You have a fever. Get help right away if: You have symptoms of a severe allergic reaction. These include:  Wheezing or difficulty breathing.  Tightness in the chest or chest pain.  Light-headedness or fainting.  Itchy, raised, red patches on the skin.  Nausea or vomiting.  Abdominal cramping.  Diarrhea.  A swollen tongue or lips, or trouble swallowing.  Dizziness or fainting. Summary  Stings from bees, wasps, and hornets can cause pain and inflammation, but they are usually not serious. However, some people may have an allergic reaction to a sting. This can cause the symptoms to  be more severe.  Pay close attention to your symptoms after you have been stung. If possible, have someone stay with you to make sure you do not have an allergic reaction.  Call your health care provider if you have any signs of an allergic reaction. This information is not intended to replace advice given to you by your health care provider. Make sure you discuss any questions you have with your health care provider. Document Revised: 06/03/2020 Document Reviewed: 06/03/2020 Elsevier Patient Education  2021 [employer].

## 2021-04-01 ENCOUNTER — Other Ambulatory Visit: Payer: Self-pay

## 2021-04-01 ENCOUNTER — Ambulatory Visit: Payer: BC Managed Care – PPO | Admitting: Medical

## 2021-04-01 ENCOUNTER — Encounter: Payer: Self-pay | Admitting: Medical

## 2021-04-01 VITALS — BP 132/86 | HR 108 | Temp 98.3°F | Resp 16 | Wt 217.0 lb

## 2021-04-01 DIAGNOSIS — J0100 Acute maxillary sinusitis, unspecified: Secondary | ICD-10-CM

## 2021-04-01 DIAGNOSIS — B349 Viral infection, unspecified: Secondary | ICD-10-CM

## 2021-04-01 DIAGNOSIS — Z20822 Contact with and (suspected) exposure to covid-19: Secondary | ICD-10-CM

## 2021-04-01 LAB — POC COVID19 BINAXNOW: SARS Coronavirus 2 Ag: NEGATIVE

## 2021-04-01 NOTE — Patient Instructions (Signed)
Otc childrens Claritin, Flonse and plain Mucinex follow packae instructions. If lasting more then 10 days staraSinusitis, Adult Sinusitis is soreness and swelling (inflammation) of your sinuses. Sinuses are hollow spaces in the bones around your face. They are located: Around your eyes. In the middle of your forehead. Behind your nose. In your cheekbones. Your sinuses and nasal passages are lined with a fluid called mucus. Mucus drains out of your sinuses. Swelling can trap mucus in your sinuses. This lets germs (bacteria, virus, or fungus) grow, which leads to infection. Most of the time, this condition is caused bya virus. What are the causes? This condition is caused by: Allergies. Asthma. Germs. Things that block your nose or sinuses. Growths in the nose (nasal polyps). Chemicals or irritants in the air. Fungus (rare). What increases the risk? You are more likely to develop this condition if: You have a weak body defense system (immune system). You do a lot of swimming or diving. You use nasal sprays too much. You smoke. What are the signs or symptoms? The main symptoms of this condition are pain and a feeling of pressure around the sinuses. Other symptoms include: Stuffy nose (congestion). Runny nose (drainage). Swelling and warmth in the sinuses. Headache. Toothache. A cough that may get worse at night. Mucus that collects in the throat or the back of the nose (postnasal drip). Being unable to smell and taste. Being very tired (fatigue). A fever. Sore throat. Bad breath. How is this diagnosed? This condition is diagnosed based on: Your symptoms. Your medical history. A physical exam. Tests to find out if your condition is short-term (acute) or long-term (chronic). Your doctor may: Check your nose for growths (polyps). Check your sinuses using a tool that has a light (endoscope). Check for allergies or germs. Do imaging tests, such as an MRI or CT scan. How is this  treated? Treatment for this condition depends on the cause and whether it is short-term or long-term. If caused by a virus, your symptoms should go away on their own within 10 days. You may be given medicines to relieve symptoms. They include: Medicines that shrink swollen tissue in the nose. Medicines that treat allergies (antihistamines). A spray that treats swelling of the nostrils.  Rinses that help get rid of thick mucus in your nose (nasal saline washes). If caused by bacteria, your doctor may wait to see if you will get better without treatment. You may be given antibiotic medicine if you have: A very bad infection. A weak body defense system. If caused by growths in the nose, you may need to have surgery. Follow these instructions at home: Medicines Take, use, or apply over-the-counter and prescription medicines only as told by your doctor. These may include nasal sprays. If you were prescribed an antibiotic medicine, take it as told by your doctor. Do not stop taking the antibiotic even if you start to feel better. Hydrate and humidify  Drink enough water to keep your pee (urine) pale yellow. Use a cool mist humidifier to keep the humidity level in your home above 50%. Breathe in steam for 10-15 minutes, 3-4 times a day, or as told by your doctor. You can do this in the bathroom while a hot shower is running. Try not to spend time in cool or dry air.  Rest Rest as much as you can. Sleep with your head raised (elevated). Make sure you get enough sleep each night. General instructions  Put a warm, moist washcloth on your face 3-4  times a day, or as often as told by your doctor. This will help with discomfort. Wash your hands often with soap and water. If there is no soap and water, use hand sanitizer. Do not smoke. Avoid being around people who are smoking (secondhand smoke). Keep all follow-up visits as told by your doctor. This is important.  Contact a doctor if: You have a  fever. Your symptoms get worse. Your symptoms do not get better within 10 days. Get help right away if: You have a very bad headache. You cannot stop throwing up (vomiting). You have very bad pain or swelling around your face or eyes. You have trouble seeing. You feel confused. Your neck is stiff. You have trouble breathing. Summary Sinusitis is swelling of your sinuses. Sinuses are hollow spaces in the bones around your face. This condition is caused by tissues in your nose that become inflamed or swollen. This traps germs. These can lead to infection. If you were prescribed an antibiotic medicine, take it as told by your doctor. Do not stop taking it even if you start to feel better. Keep all follow-up visits as told by your doctor. This is important. This information is not intended to replace advice given to you by your health care provider. Make sure you discuss any questions you have with your healthcare provider. Document Revised: 01/09/2018 Document Reviewed: 01/09/2018 Elsevier Patient Education  2022 [employer].  antibiotic.

## 2021-04-01 NOTE — Progress Notes (Signed)
Subjective:    Patient ID: Felicia Mason, female    DOB: April 24, 1961, 60 y.o.   MRN: [id number]  HPI 60 yo female in non acute distress presents to the clinic with right  sided  facial pain  5/10  today however yesterday was a  10/10.and   has clear nasal drainage. Right lymphadenopathy.Started last week Salt water gargle which helped a little bit. Took an Avdil with relief.yesterday.  Patient stressed due to training at work.   [place] and  3 boosters Blood pressure 132/86, pulse (!) 108, temperature 98.3 F (36.8 C), temperature source Oral, resp. rate 16, weight 217 lb (98.4 kg), SpO2 95 %.  Allergies  Allergen Reactions   Apple Itching     Review of Systems  Constitutional:  Positive for fatigue (from not sleeping). Negative for chills and fever.  HENT:  Positive for congestion (right side), ear pain (pressure), postnasal drip, rhinorrhea (clear), sinus pressure, sinus pain (right side), sneezing and sore throat.   Respiratory:  Negative for cough and shortness of breath.   Cardiovascular:  Negative for chest pain.  Gastrointestinal:  Negative for abdominal pain and diarrhea.  Allergic/Immunologic: Positive for environmental allergies.  Neurological:  Negative for dizziness, light-headedness and headaches (right side).      Objective:   Physical Exam Vitals and nursing note reviewed.  Constitutional:      Appearance: She is well-developed.  HENT:     Head: Normocephalic and atraumatic.     Right Ear: Ear canal normal. A middle ear effusion is present.     Left Ear: Ear canal normal. A middle ear effusion is present.     Nose: Congestion and rhinorrhea (clear,  PND clear) present.     Mouth/Throat:     Mouth: Mucous membranes are moist.     Pharynx: Oropharynx is clear. Uvula midline.     Tonsils: No tonsillar exudate or tonsillar abscesses. 0 on the left.  Eyes:     Conjunctiva/sclera: Conjunctivae normal.     Pupils: Pupils are equal, round, and reactive to  light.  Cardiovascular:     Rate and Rhythm: Normal rate and regular rhythm.     Heart sounds: Normal heart sounds.  Pulmonary:     Effort: Pulmonary effort is normal.  Musculoskeletal:     Cervical back: Normal range of motion and neck supple.  Lymphadenopathy:     Cervical: No cervical adenopathy.  Skin:    General: Skin is warm.  Neurological:     General: No focal deficit present.     Mental Status: She is alert and oriented to person, place, and time.  Psychiatric:        Mood and Affect: Mood normal.        Behavior: Behavior normal.     Covid-19 Negative     Assessment & Plan:  Sinusitis maxillary viral Otc childrens Claritin, Flonase and plain Mucinex follow package instructions. If lasting more then 10 days start antibiotics. If facial pain returns 10/10, yellow to green discharge or fever or chills , start antibiotics. Nettie pot if okay to do. Continue Salt gargles Return to the clinic as needed.  jPatient verbalizes understanding and has no questions at discharge.Antibiotics if needed, prescribed reviewed s/s of when to start antibiotics. She verbalizes understanding and has no questions at discharge. Meds ordered this encounter  Medications   amoxicillin-clavulanate (AUGMENTIN) 875-125 MG tablet    Sig: Take 1 tablet by mouth 2 (two) times daily.    Dispense:  20 tablet    Refill:  0

## 2021-04-06 MED ORDER — AMOXICILLIN-POT CLAVULANATE 875-125 MG PO TABS: 1.0000 | ORAL_TABLET | Freq: Two times a day (BID) | ORAL | 0 refills | Status: DC

## 2021-07-24 ENCOUNTER — Other Ambulatory Visit: Payer: Self-pay | Admitting: Obstetrics and Gynecology

## 2021-07-24 DIAGNOSIS — Z1231 Encounter for screening mammogram for malignant neoplasm of breast: Secondary | ICD-10-CM

## 2021-08-12 ENCOUNTER — Other Ambulatory Visit: Payer: Self-pay

## 2021-08-12 ENCOUNTER — Ambulatory Visit
Admission: RE | Admit: 2021-08-12 | Discharge: 2021-08-12 | Disposition: A | Discharge status: Home / Self Care | Payer: BC Managed Care – PPO | Source: Ambulatory Visit | Attending: Obstetrics and Gynecology | Admitting: Obstetrics and Gynecology

## 2021-08-12 DIAGNOSIS — Z1231 Encounter for screening mammogram for malignant neoplasm of breast: Secondary | ICD-10-CM | POA: Insufficient documentation

## 2022-03-30 ENCOUNTER — Ambulatory Visit (INDEPENDENT_AMBULATORY_CARE_PROVIDER_SITE_OTHER): Payer: Self-pay | Admitting: Nurse Practitioner

## 2022-03-30 ENCOUNTER — Encounter: Payer: Self-pay | Admitting: Nurse Practitioner

## 2022-03-30 VITALS — BP 120/84 | HR 84 | Temp 98.3°F | Ht 64.0 in | Wt 217.2 lb

## 2022-03-30 DIAGNOSIS — N3090 Cystitis, unspecified without hematuria: Secondary | ICD-10-CM

## 2022-03-30 DIAGNOSIS — R300 Dysuria: Secondary | ICD-10-CM

## 2022-03-30 LAB — POCT URINALYSIS DIPSTICK
Bilirubin, UA: NEGATIVE
Blood, UA: NEGATIVE
Glucose, UA: NEGATIVE
Ketones, UA: NEGATIVE
Nitrite, UA: NEGATIVE
Protein, UA: NEGATIVE
Spec Grav, UA: 1.015 (ref 1.010–1.025)
Urobilinogen, UA: 0.2 E.U./dL
pH, UA: 6.5 (ref 5.0–8.0)

## 2022-03-30 MED ORDER — NITROFURANTOIN MONOHYD MACRO 100 MG PO CAPS: 100.0000 mg | ORAL_CAPSULE | Freq: Two times a day (BID) | ORAL | 0 refills | Status: AC

## 2022-03-30 NOTE — Progress Notes (Signed)
   Subjective:    Patient ID: Felicia Mason, female    DOB: 1960/09/09, 61 y.o.   MRN: [id number]  HPI  61 year old female presenting to [facility] with complaints of inability to fully empty bladder. Feelings of fever overnight.   She has been pushing her water and feeling somewhat better today.   Denies back pain, N/V today.   Denies a history of complicated UTIs or Kidney disease.   Most recent urine culture from 2020 shows susceptibility to all antibiotics/none found to be resistant at that time.    Today's Vitals   03/30/22 0741  BP: 120/84  Pulse: 84  Temp: 98.3 F (36.8 C)  TempSrc: Tympanic  SpO2: 98%  Weight: 217 lb 3.2 oz (98.5 kg)  Height: 5\' 4"  (1.626 m)   Body mass index is 37.28 kg/m.   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     Review of Systems  Constitutional: Negative.   HENT: Negative.    Respiratory: Negative.    Gastrointestinal: Negative.   Genitourinary:  Positive for difficulty urinating, dysuria, frequency and urgency.  Neurological: Negative.        Objective:   Physical Exam Cardiovascular:     Rate and Rhythm: Normal rate and regular rhythm.  Pulmonary:     Effort: Pulmonary effort is normal.  Abdominal:     Tenderness: There is no right CVA tenderness or left CVA tenderness.  Neurological:     General: No focal deficit present.     Mental Status: She is alert.  Psychiatric:        Mood and Affect: Mood normal.     Recent Results (from the past 2160 hour(s))  POCT Urinalysis Dipstick     Status: Abnormal   Collection Time: 03/30/22  7:56 AM  Result Value Ref Range   Color, UA yellow    Clarity, UA clear    Glucose, UA Negative Negative   Bilirubin, UA negative    Ketones, UA negative    Spec Grav, UA 1.015 1.010 - 1.025   Blood, UA negative    pH, UA 6.5 5.0 - 8.0    Protein, UA Negative Negative   Urobilinogen, UA 0.2 0.2 or 1.0 E.U./dL   Nitrite, UA negative    Leukocytes, UA Trace (A) Negative   Appearance clear    Odor none          Assessment & Plan:   1. Dysuria  - POCT Urinalysis Dipstick - Urine Culture (pending) will follow up with patient when available   2. Cystitis Mild findings on dip, patient will monitor symptoms throughout the day and if urgency returns or other symptoms as discussed she will start Meds ordered this encounter  Medications   nitrofurantoin, macrocrystal-monohydrate, (MACROBID) 100 MG capsule    Sig: Take 1 capsule (100 mg total) by mouth 2 (two) times daily for 5 days.    Dispense:  10 capsule    Refill:  0

## 2022-04-01 ENCOUNTER — Telehealth: Payer: Self-pay | Admitting: Nurse Practitioner

## 2022-04-02 LAB — URINE CULTURE

## 2022-04-02 NOTE — Telephone Encounter (Signed)
Patient did start medication for UTI and is improving   Relayed message that culture returned with e. Coli sensitivity pending- will f/u with final results when available   Recent Results (from the past 2160 hour(s))  POCT Urinalysis Dipstick     Status: Abnormal   Collection Time: 03/30/22  7:56 AM  Result Value Ref Range   Color, UA yellow    Clarity, UA clear    Glucose, UA Negative Negative   Bilirubin, UA negative    Ketones, UA negative    Spec Grav, UA 1.015 1.010 - 1.025   Blood, UA negative    pH, UA 6.5 5.0 - 8.0   Protein, UA Negative Negative   Urobilinogen, UA 0.2 0.2 or 1.0 E.U./dL   Nitrite, UA negative    Leukocytes, UA Trace (A) Negative   Appearance clear    Odor none   Urine Culture     Status: Abnormal (Preliminary result)   Collection Time: 03/30/22  8:18 AM   Specimen: Urine   Urine  Release to pati  Result Value Ref Range   Urine Culture, Routine Preliminary report (A)    Organism ID, Bacteria Escherichia coli (A)     Comment: 50,000-100,000 colony forming units per mL

## 2022-04-05 ENCOUNTER — Encounter: Payer: Self-pay | Admitting: Nurse Practitioner

## 2022-07-06 ENCOUNTER — Other Ambulatory Visit: Payer: Self-pay | Admitting: Obstetrics and Gynecology

## 2022-07-06 DIAGNOSIS — Z1231 Encounter for screening mammogram for malignant neoplasm of breast: Secondary | ICD-10-CM

## 2022-08-26 ENCOUNTER — Ambulatory Visit
Admission: RE | Admit: 2022-08-26 | Discharge: 2022-08-26 | Disposition: A | Discharge status: Home / Self Care | Payer: BC Managed Care – PPO | Source: Ambulatory Visit | Attending: Obstetrics and Gynecology | Admitting: Obstetrics and Gynecology

## 2022-08-26 DIAGNOSIS — Z1231 Encounter for screening mammogram for malignant neoplasm of breast: Secondary | ICD-10-CM | POA: Diagnosis not present

## 2022-09-24 ENCOUNTER — Ambulatory Visit (INDEPENDENT_AMBULATORY_CARE_PROVIDER_SITE_OTHER): Payer: Self-pay | Admitting: Adult Health

## 2022-09-24 ENCOUNTER — Encounter: Payer: Self-pay | Admitting: Adult Health

## 2022-09-24 VITALS — BP 124/82 | HR 78 | Temp 98.1°F | Wt 214.0 lb

## 2022-09-24 DIAGNOSIS — J101 Influenza due to other identified influenza virus with other respiratory manifestations: Secondary | ICD-10-CM

## 2022-09-24 DIAGNOSIS — R0981 Nasal congestion: Secondary | ICD-10-CM

## 2022-09-24 LAB — POC SOFIA 2 FLU + SARS ANTIGEN FIA
Influenza A, POC: POSITIVE — AB
Influenza B, POC: NEGATIVE
SARS Coronavirus 2 Ag: NEGATIVE

## 2022-09-24 MED ORDER — OSELTAMIVIR PHOSPHATE 75 MG PO CAPS: 75.0000 mg | ORAL_CAPSULE | Freq: Two times a day (BID) | ORAL | 0 refills | Status: AC

## 2022-09-24 NOTE — Progress Notes (Signed)
[facility] [address],  [address]   Office Visit Note  Patient Name: Felicia Mason Date of Birth [id number]  Medical Record number [id number]  Date of Service: 09/24/2022  Chief Complaint  Patient presents with   Cough    Started Tue. Dry cough, this morning jaw felt tender. Sinus congestion, voice. Feverish a couple days ago. Has not done a COVID test.      Cough Associated symptoms include a fever. Pertinent negatives include no chest pain or chills.   Pt is here for a sick visit. Patient reports 3 days ago she started having some sinus pressure.  She started doing the Netty pot, and drinking orange juice, vapo rub, diffuser etc. This morning she noticed some left side facial puffiness, and tenderness. Also loss of appetite, and some fever. Some sick contacts.  Significant history of sinus infections.    Current Medication:  Outpatient Encounter Medications as of 09/24/2022  Medication Sig   Cholecalciferol (VITAMIN D-1000 MAX ST) 25 MCG (1000 UT) tablet Take by mouth. (Patient not taking: Reported on 09/24/2022)   No facility-administered encounter medications on file as of 09/24/2022.      Medical History: No past medical history on file.   Vital Signs: BP 124/82 (BP Location: Left Arm, Patient Position: Sitting, Cuff Size: Normal)   Pulse 78   Temp 98.1 F (36.7 C) (Tympanic)   Wt 214 lb (97.1 kg)   SpO2 98%   BMI 36.73 kg/m    Review of Systems  Constitutional:  Positive for fever. Negative for chills and fatigue.  HENT:  Positive for sinus pressure.   Respiratory:  Positive for cough.   Cardiovascular:  Negative for chest pain.  Gastrointestinal:  Negative for diarrhea, nausea and vomiting.    Physical Exam Vitals reviewed.  Constitutional:      Appearance: Normal appearance.  HENT:     Head: Normocephalic.     Right Ear: Tympanic membrane and ear canal normal.     Left Ear: Tympanic membrane and ear canal normal.      Nose: Nose normal.     Mouth/Throat:     Mouth: Mucous membranes are moist.  Pulmonary:     Effort: Pulmonary effort is normal.     Breath sounds: Normal breath sounds.  Musculoskeletal:     Comments: Mild swelling at left TMJ joint  Neurological:     Mental Status: She is alert.    Results for orders placed or performed in visit on 09/24/22 (from the past 24 hour(s))  POC SOFIA 2 FLU + SARS ANTIGEN FIA     Status: Abnormal   Collection Time: 09/24/22 12:26 PM  Result Value Ref Range   Influenza A, POC Positive (A) Negative   Influenza B, POC Negative Negative   SARS Coronavirus 2 Ag Negative Negative    Assessment/Plan: 1. Influenza A Discussed Potential benefit and adverse effects of Tamiflu.  Patient elected to take treatment.  Encourage supportive measures (fluids, rest cough drops, tea) and over the counter meds (I.e. analgesic/anti-inflammatory, decongestant, antihistamine, cough suppressant)  as needed for symptom relief.  Discussed infection control measures to prevent spread of infection.  Advised she should not return to class until afebrile (100.4 or above) for 24 hours.  Advised patient to send me a message in MyChart if continue to have fever.  Return to clinic as needed for new/worsening symptoms (I.e. shortness of breath) or if symptoms are not resolving as expected over the next  5-7 days.    2. Congestion of nasal sinus - POC SOFIA 2 FLU + SARS ANTIGEN FIA     General Counseling: Felicia Mason verbalizes understanding of the findings of todays visit and agrees with plan of treatment. I have discussed any further diagnostic evaluation that may be needed or ordered today. We also reviewed her medications today. she has been encouraged to call the office with any questions or concerns that should arise related to todays visit.   Orders Placed This Encounter  Procedures   POC SOFIA 2 FLU + SARS ANTIGEN FIA    No orders of the defined types were placed in this  encounter.   Time spent:20 Minutes    Kendell Bane AGNP-C Nurse Practitioner
